# Patient Record
Sex: Male | Born: 1990 | Hispanic: Yes | Marital: Single | Smoking: Former smoker
Health system: Southern US, Community
[De-identification: ages and names within clinical notes are randomized; demographics above are authoritative.]

## PROBLEM LIST (undated history)

## (undated) HISTORY — PX: OTHER SURGICAL HISTORY: SHX169

---

## 2011-02-13 ENCOUNTER — Encounter (HOSPITAL_COMMUNITY): Payer: Self-pay | Admitting: *Deleted

## 2011-02-13 ENCOUNTER — Other Ambulatory Visit: Payer: Self-pay

## 2011-02-13 ENCOUNTER — Emergency Department (HOSPITAL_COMMUNITY)
Admission: EM | Admit: 2011-02-13 | Discharge: 2011-02-13 | Disposition: A | Discharge status: Home / Self Care | Payer: No Typology Code available for payment source | Attending: Emergency Medicine | Admitting: Emergency Medicine

## 2011-02-13 DIAGNOSIS — F329 Major depressive disorder, single episode, unspecified: Secondary | ICD-10-CM | POA: Insufficient documentation

## 2011-02-13 DIAGNOSIS — R0602 Shortness of breath: Secondary | ICD-10-CM | POA: Insufficient documentation

## 2011-02-13 DIAGNOSIS — F410 Panic disorder [episodic paroxysmal anxiety] without agoraphobia: Secondary | ICD-10-CM | POA: Insufficient documentation

## 2011-02-13 DIAGNOSIS — F411 Generalized anxiety disorder: Secondary | ICD-10-CM | POA: Insufficient documentation

## 2011-02-13 DIAGNOSIS — R002 Palpitations: Secondary | ICD-10-CM | POA: Insufficient documentation

## 2011-02-13 DIAGNOSIS — F3289 Other specified depressive episodes: Secondary | ICD-10-CM | POA: Insufficient documentation

## 2011-02-13 DIAGNOSIS — R000 Tachycardia, unspecified: Secondary | ICD-10-CM | POA: Insufficient documentation

## 2011-02-13 LAB — POCT I-STAT, CHEM 8
BUN: 11 mg/dL (ref 6–23)
Calcium, Ion: 1.09 mmol/L — ABNORMAL LOW (ref 1.12–1.32)
Chloride: 104 mEq/L (ref 96–112)
Creatinine, Ser: 0.9 mg/dL (ref 0.50–1.35)

## 2011-02-13 MED ORDER — LORAZEPAM 1 MG PO TABS
1.0000 mg | ORAL_TABLET | Freq: Once | ORAL | Status: AC
  Administered 2011-02-13: 1 mg via ORAL
  Filled 2011-02-13: qty 1

## 2011-02-13 NOTE — ED Notes (Signed)
Patient c/o sudden onset palpitations that began approx. 1 hr ago accompanied with a progressive onset of numbness from abdomen up to face. Reports being able to feel touch but everything feels like it is "contracting". Patient currently on cardiac monitor, instructed to bear down with no relief. HR remains sinus in the 130s-140s. Patient very anxious at time of assessment.

## 2011-02-13 NOTE — ED Provider Notes (Signed)
History     CSN: [id number]  Arrival date & time 02/13/11  1517   First MD Initiated Contact with Patient 02/13/11 1947      Chief Complaint  Patient presents with  . Palpitations    (Consider location/radiation/quality/duration/timing/severity/associated sxs/prior treatment) Patient is a 21 y.o. male presenting with palpitations. The history is provided by the patient.  Palpitations  This is a new problem. The current episode started 1 to 2 hours ago. The problem occurs constantly. The problem has not changed since onset.The problem is associated with anxiety. On average, each episode lasts 2 hours. Associated symptoms include shortness of breath. Pertinent negatives include no diaphoresis, no fever, no malaise/fatigue, no chest pain, no chest pressure, no abdominal pain and no lower extremity edema. Associated symptoms comments: Arms and legs tingling, feeling of impending doomed, very anxious. He has tried nothing for the symptoms. The treatment provided no relief. Risk factors include stress. His past medical history does not include anemia, heart disease, hyperthyroidism or valve disorder.    History reviewed. No pertinent past medical history.  History reviewed. No pertinent past surgical history.  History reviewed. No pertinent family history.  History  Substance Use Topics  . Smoking status: Not on file  . Smokeless tobacco: Not on file  . Alcohol Use: Not on file      Review of Systems  Constitutional: Negative for fever, malaise/fatigue and diaphoresis.  Respiratory: Positive for shortness of breath.   Cardiovascular: Positive for palpitations. Negative for chest pain.  Gastrointestinal: Negative for abdominal pain.  All other systems reviewed and are negative.    Allergies  Review of patient's allergies indicates no known allergies.  Home Medications  No current outpatient prescriptions on file.  BP 157/89  Pulse 119  Temp(Src) 97.3 F (36.3 C) (Oral)   Resp 18  SpO2 100%  Physical Exam  Nursing note and vitals reviewed. Constitutional: He is oriented to person, place, and time. He appears well-developed and well-nourished. No distress.       tearful  HENT:  Head: Normocephalic and atraumatic.  Mouth/Throat: Oropharynx is clear and moist.  Eyes: Conjunctivae and EOM are normal. Pupils are equal, round, and reactive to light.  Neck: Normal range of motion. Neck supple.  Cardiovascular: Regular rhythm and intact distal pulses.  Tachycardia present.   No murmur heard. Pulmonary/Chest: Effort normal and breath sounds normal. No respiratory distress. He has no wheezes. He has no rales.  Abdominal: Soft. He exhibits no distension. There is no tenderness. There is no rebound and no guarding.  Musculoskeletal: Normal range of motion. He exhibits no edema and no tenderness.  Neurological: He is alert and oriented to person, place, and time.  Skin: Skin is warm and dry. No rash noted. No erythema.  Psychiatric: His behavior is normal. His mood appears anxious. He exhibits a depressed mood. He expresses no homicidal and no suicidal ideation.    ED Course  Procedures (including critical care time)  Labs Reviewed  POCT I-STAT, CHEM 8 - Abnormal; Notable for the following:    Potassium 3.4 (*)    Glucose, Bld 163 (*)    Calcium, Ion 1.09 (*)    All other components within normal limits  TSH   No results found.   Date: 02/13/2011  Rate: 121  Rhythm: sinus tachycardia  QRS Axis: right  Intervals: normal  ST/T Wave abnormalities: normal  Conduction Disutrbances:none  Narrative Interpretation:   Old EKG Reviewed: none available    1. Panic attack  MDM   Patient with history suggestive of panic attack tonight. Palpitations and feeling as if he were going to die. Here he was tachycardic with a heart rate from 120-140 EKG taken at that time showed a regular sinus rhythm during both episodes once with heart rate of 1:30 and once  with 120. Patient was hyperventilating and standing he can feel his arms or his legs. After coached to take slow deep breaths and to relax his heart rate improved to 100 and and he was feeling better. After long discussion with him it seems as if the patient is depressed even though he denies depression. He states his problems are worse in anybody else's and denies any suicidal ideation. He states occasionally he referred himself to feel pain. He denies any substance use. TSH sent however low suspicion for hyperthyroidism. I-STAT within normal limits. Ativan given which he states helped.        Gwyneth Sprout, MD 02/13/11 [phone]

## 2011-02-13 NOTE — ED Notes (Signed)
Pt in c/o episode of palpitations, states he was standing in line to get food when this started, worsened, noted numbness in hands and arms, pt anxious at this time

## 2011-02-14 LAB — TSH: TSH: 1.062 u[IU]/mL (ref 0.350–4.500)

## 2011-06-25 ENCOUNTER — Encounter: Payer: Self-pay | Admitting: Internal Medicine

## 2011-06-25 ENCOUNTER — Ambulatory Visit (INDEPENDENT_AMBULATORY_CARE_PROVIDER_SITE_OTHER): Payer: No Typology Code available for payment source | Admitting: Internal Medicine

## 2011-06-25 DIAGNOSIS — R1013 Epigastric pain: Secondary | ICD-10-CM

## 2011-06-25 DIAGNOSIS — R000 Tachycardia, unspecified: Secondary | ICD-10-CM

## 2011-06-25 DIAGNOSIS — F419 Anxiety disorder, unspecified: Secondary | ICD-10-CM

## 2011-06-25 DIAGNOSIS — E876 Hypokalemia: Secondary | ICD-10-CM

## 2011-06-25 DIAGNOSIS — F411 Generalized anxiety disorder: Secondary | ICD-10-CM

## 2011-06-25 DIAGNOSIS — K3189 Other diseases of stomach and duodenum: Secondary | ICD-10-CM

## 2011-06-25 DIAGNOSIS — R9431 Abnormal electrocardiogram [ECG] [EKG]: Secondary | ICD-10-CM

## 2011-06-25 DIAGNOSIS — R002 Palpitations: Secondary | ICD-10-CM

## 2011-06-25 MED ORDER — ESOMEPRAZOLE MAGNESIUM 40 MG PO CPDR: 40.0000 mg | DELAYED_RELEASE_CAPSULE | Freq: Every day | ORAL | Status: DC

## 2011-06-25 NOTE — Progress Notes (Addendum)
Patient ID: Arthur Hood, male   DOB: 05-13-1990, 22 y.o.   MRN: [id number]  Patient Active Problem List  Diagnosis  . Anxiety  . Abnormal EKG  . Dyspepsia    Subjective:  CC:   Chief Complaint  Patient presents with  . New Patient    HPI:   Arthur Hood a 21 y.o. male who presents as a New patient here to establish primary care .  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  His cc is a request for workup due to recent episode of tachycardia followed by his entire body going numb.  He was evaluated at [facility] ER when episode occurred and was treated for panic attack.  This episode was the culmination of long period of "not feeling well."  Although most symptoms resolved ,  He continues to describe a burning discomfort in his abdomen which has been present for about 4 months.  .  Previously he had a visual phenomenon of feeling of darkness (Like someone had turned out the light),  Which resolved a month ago.  Chest pain and palpitations were also experienced previously, and he had an isolated episode during freshman year as well.  These symptoms resolved when he went home from school.  Last week  He had 2 days of excessive urination (10 or 15 voids per day) without pain or burning, which resolved spontaneously.  He does admit to borrowing a friend's ADD medication on occasion to help him concentrate (wasn't sure what it was,  10 mg dose?)  But stopped using it and the nervousness resolved.  Still having reflux symptoms treated with his mother's  zantac, which caused headaches, but is tolerating a reduced dose.  Currently taking no medications regularly, last zantac dose 1 week ago,  No current symptoms.  Has some test taking anxiety .  Ran track in high school.    History reviewed. No pertinent past medical history.  Past Surgical History  Procedure Date  . Adnoidectomy          The following portions of the patient's history were reviewed and updated as appropriate:  Allergies, current medications, and problem list.    Review of Systems:   12 Pt  review of systems was negative except those addressed in the HPI,     History   Social History  . Marital Status: Single    Spouse Name: N/A    Number of Children: N/A  . Years of Education: N/A   Occupational History  . Not on file.   Social History Main Topics  . Smoking status: Former Smoker    Types: Cigarettes    Quit date: 06/25/2010  . Smokeless tobacco: Never Used  . Alcohol Use: Yes  . Drug Use: Yes  . Sexually Active: Not on file   Other Topics Concern  . Not on file   Social History Narrative  . No narrative on file    Objective:  BP 132/70  Pulse 97  Temp 98 F (36.7 C) (Oral)  Resp 16  Ht 5' 8.5" (1.74 m)  Wt 171 lb 8 oz (77.792 kg)  BMI 25.70 kg/m2  SpO2 97%  General appearance: alert, cooperative and appears stated age Ears: normal TM's and external ear canals both ears Throat: lips, mucosa, and tongue normal; teeth and gums normal Neck: no adenopathy, no carotid bruit, supple, symmetrical, trachea midline and thyroid not enlarged, symmetric, no tenderness/mass/nodules Back: symmetric, no curvature. ROM normal. No CVA tenderness.  Lungs: clear to auscultation bilaterally Heart: regular rate and rhythm, S1, S2 normal, no murmur, click, rub or gallop Abdomen: soft, non-tender; bowel sounds normal; no masses,  no organomegaly Pulses: 2+ and symmetric Skin: Skin color, texture, turgor normal. No rashes or lesions Lymph nodes: Cervical, supraclavicular, and axillary nodes normal.  Assessment and Plan:  Abnormal EKG He has had episodes of palpitations which were likely brought on by use of stimulants,  But his EKG has borderline criteria for LVH.  Will refer to cardiology for ECHO/  Anxiety With prior panic attacks described, possibly brought on by use of stimulants.  Discussed risks of using these and other nonprescribed medications.  Will check TSH,  Return in  one month and consider SSRI vs beta blocker for test taking anxiety.   Dyspepsia With several family members described as having stomach problems,  May be gastritis vs IBS. Trial of nexium with 2 weeks of samples given.    Updated Medication List Outpatient Encounter Prescriptions as of 06/25/2011  Medication Sig Dispense Refill  . ranitidine (ZANTAC) 75 MG tablet Take 75 mg by mouth 2 (two) times daily.      Marland Kitchen esomeprazole (NEXIUM) 40 MG capsule Take 1 capsule (40 mg total) by mouth daily before breakfast.  10 capsule  0     Orders Placed This Encounter  Procedures  . COMPLETE METABOLIC PANEL WITH GFR  . TSH  . Calcium, ionized  . Magnesium  . Electrocardiogram report    Return in about 1 week (around 07/02/2011).

## 2011-06-25 NOTE — Patient Instructions (Signed)
I have given you samples of a stomach medication called Nexium.  Taken once daily,  It will block acid production and resolve your symptoms if they are coming from reflux  Return in one week to discuss how to treat your anxiety

## 2011-06-26 DIAGNOSIS — R1013 Epigastric pain: Secondary | ICD-10-CM | POA: Insufficient documentation

## 2011-06-26 DIAGNOSIS — R9431 Abnormal electrocardiogram [ECG] [EKG]: Secondary | ICD-10-CM | POA: Insufficient documentation

## 2011-06-26 NOTE — Assessment & Plan Note (Signed)
With several family members described as having stomach problems,  May be gastritis vs IBS. Trial of nexium with 2 weeks of samples given.

## 2011-06-26 NOTE — Assessment & Plan Note (Signed)
He has had e[pisodes of palpitations which were likely brought on by use of stimulants,  But his EKG has bordelrine criteria for LVH.  Will refer to cardiology for ECHO/

## 2011-06-26 NOTE — Assessment & Plan Note (Signed)
With prior panic attacks described, possibly brought on by use of stimulants.  Discussed risks of using these and other nonprescribed medications.  Will check TSH,  Return in one month and consider SSRI vs beta blocker for test taking anxiety.

## 2011-07-08 ENCOUNTER — Ambulatory Visit (INDEPENDENT_AMBULATORY_CARE_PROVIDER_SITE_OTHER): Payer: No Typology Code available for payment source | Admitting: Internal Medicine

## 2011-07-08 ENCOUNTER — Encounter: Payer: Self-pay | Admitting: Internal Medicine

## 2011-07-08 VITALS — BP 110/66 | HR 70 | Temp 98.4°F | Resp 16 | Wt 168.2 lb

## 2011-07-08 DIAGNOSIS — E878 Other disorders of electrolyte and fluid balance, not elsewhere classified: Secondary | ICD-10-CM

## 2011-07-08 DIAGNOSIS — K3189 Other diseases of stomach and duodenum: Secondary | ICD-10-CM

## 2011-07-08 DIAGNOSIS — R9431 Abnormal electrocardiogram [ECG] [EKG]: Secondary | ICD-10-CM

## 2011-07-08 DIAGNOSIS — E876 Hypokalemia: Secondary | ICD-10-CM

## 2011-07-08 DIAGNOSIS — F419 Anxiety disorder, unspecified: Secondary | ICD-10-CM

## 2011-07-08 DIAGNOSIS — R002 Palpitations: Secondary | ICD-10-CM

## 2011-07-08 DIAGNOSIS — R1013 Epigastric pain: Secondary | ICD-10-CM

## 2011-07-08 DIAGNOSIS — F411 Generalized anxiety disorder: Secondary | ICD-10-CM

## 2011-07-08 LAB — COMPREHENSIVE METABOLIC PANEL
Albumin: 4.2 g/dL (ref 3.5–5.2)
BUN: 13 mg/dL (ref 6–23)
CO2: 28 mEq/L (ref 19–32)
GFR: 101.2 mL/min (ref >60.00)
Glucose, Bld: 95 mg/dL (ref 70–99)
Potassium: 3.5 mEq/L (ref 3.5–5.1)
Sodium: 141 mEq/L (ref 135–145)
Total Bilirubin: 0.5 mg/dL (ref 0.3–1.2)
Total Protein: 7.2 g/dL (ref 6.0–8.3)

## 2011-07-08 LAB — TSH: TSH: 2.03 u[IU]/mL (ref 0.35–5.50)

## 2011-07-08 NOTE — Assessment & Plan Note (Signed)
Now seeing a therapist,  Does not want a beta blocker for test taking anxiety/

## 2011-07-08 NOTE — Assessment & Plan Note (Signed)
Trial of nexium caused nausea.  Recommended trail of famotidine 40 mg or ranitidine 150 mg daily at bedtime and prn gaviscon.

## 2011-07-08 NOTE — Patient Instructions (Addendum)
You can try Pepcid (famotidine)  40 mg daily at bedtime  Or Zantac (ranitidine ) 150 mg at bedtime for the reflux symptoms.    You can try Gas x or mylanta Gas if your gaviscon does not have simethicone in it

## 2011-07-08 NOTE — Assessment & Plan Note (Signed)
During ER eval in Feb had low K,  Low Ca.  Rechecking both now as cause was never evaluated.

## 2011-07-08 NOTE — Assessment & Plan Note (Signed)
Minimal,  With sinus tach and borderline LVH.  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

## 2011-07-08 NOTE — Progress Notes (Signed)
Patient ID: Arthur Hood, male   DOB: 1990-11-07, 21 y.o.   MRN: [id number]   Patient Active Problem List  Diagnosis  . Anxiety  . Abnormal EKG  . Dyspepsia    Subjective:  CC:   Chief Complaint  Patient presents with  . Follow-up    one week    HPI:   Arthur Hood a 21 y.o. male who presents for  follow up on multiple complaints including tachycardia, reflux and anxiety. At first visit was given two week supply of nexium.  3 days of taking it early in the morning on an empty stomach he stopped it due to daily early morning vomiting , which  occurred before eating.  Attributed it to the nexium , stopped the nexium and the symptoms have improved.  The palpitations have resolved but he continues to have a feeling of pressure that lasts a second or two.  Never when exerciing. He has used gaviscon with good results. He is now seeing a therapist at school to help cope with his anxiety.   No uninentional weight loss or abdominal pain.   No past medical history on file.  Past Surgical History  Procedure Date  . Adnoidectomy          The following portions of the patient's history were reviewed and updated as appropriate: Allergies, current medications, and problem list.    Review of Systems:   12 Pt  review of systems was negative except those addressed in the HPI,     History   Social History  . Marital Status: Single    Spouse Name: N/A    Number of Children: N/A  . Years of Education: N/A   Occupational History  . Not on file.   Social History Main Topics  . Smoking status: Former Smoker    Types: Cigarettes    Quit date: 06/25/2010  . Smokeless tobacco: Never Used  . Alcohol Use: Yes  . Drug Use: Yes  . Sexually Active: Not on file   Other Topics Concern  . Not on file   Social History Narrative  . No narrative on file    Objective:  BP 110/66  Pulse 70  Temp 98.4 F (36.9 C) (Oral)  Resp 16  Wt 168 lb 4 oz (76.318 kg)  SpO2  97%  General appearance: alert, cooperative and appears stated age Throat: lips, mucosa, and tongue normal; teeth and gums normal Neck: no adenopathy, no carotid bruit, supple, symmetrical, trachea midline and thyroid not enlarged, symmetric, no tenderness/mass/nodules Back: symmetric, no curvature. ROM normal. No CVA tenderness. Lungs: clear to auscultation bilaterally Heart: regular rate and rhythm, S1, S2 normal, no murmur, click, rub or gallop Abdomen: soft, non-tender; bowel sounds normal; no masses,  no organomegaly Pulses: 2+ and symmetric   Assessment and Plan: Dyspepsia Trial of nexium caused nausea.  Recommended trail of famotidine 40 mg or ranitidine 150 mg daily at bedtime and prn gaviscon.   Abnormal EKG Minimal,  With sinus tach and borderline LVH.  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   Anxiety Now seeing a therapist,  Does not want a beta blocker for test taking anxiety/   Electrolyte abnormality During ER eval in Feb had low K,  Low Ca.  Rechecking both now as cause was never evaluated.    Updated Medication List Outpatient Encounter Prescriptions as of 07/08/2011  Medication Sig Dispense Refill  . DISCONTD: esomeprazole (NEXIUM) 40 MG capsule Take 1 capsule (40  mg total) by mouth daily before breakfast.  10 capsule  0  . DISCONTD: ranitidine (ZANTAC) 75 MG tablet Take 75 mg by mouth 2 (two) times daily.

## 2011-07-09 LAB — CALCIUM, IONIZED: Calcium, Ion: 1.24 mmol/L (ref 1.12–1.32)

## 2014-01-16 ENCOUNTER — Emergency Department (HOSPITAL_COMMUNITY)
Admission: EM | Admit: 2014-01-16 | Discharge: 2014-01-16 | Disposition: A | Discharge status: Home / Self Care | Payer: Worker's Compensation | Attending: Emergency Medicine | Admitting: Emergency Medicine

## 2014-01-16 ENCOUNTER — Encounter (HOSPITAL_COMMUNITY): Payer: Self-pay

## 2014-01-16 ENCOUNTER — Emergency Department (HOSPITAL_COMMUNITY): Payer: Worker's Compensation

## 2014-01-16 DIAGNOSIS — S61319A Laceration without foreign body of unspecified finger with damage to nail, initial encounter: Secondary | ICD-10-CM

## 2014-01-16 DIAGNOSIS — S61012A Laceration without foreign body of left thumb without damage to nail, initial encounter: Secondary | ICD-10-CM

## 2014-01-16 DIAGNOSIS — S6992XA Unspecified injury of left wrist, hand and finger(s), initial encounter: Secondary | ICD-10-CM | POA: Diagnosis present

## 2014-01-16 DIAGNOSIS — Y9289 Other specified places as the place of occurrence of the external cause: Secondary | ICD-10-CM | POA: Insufficient documentation

## 2014-01-16 DIAGNOSIS — Y998 Other external cause status: Secondary | ICD-10-CM | POA: Insufficient documentation

## 2014-01-16 DIAGNOSIS — W230XXA Caught, crushed, jammed, or pinched between moving objects, initial encounter: Secondary | ICD-10-CM | POA: Insufficient documentation

## 2014-01-16 DIAGNOSIS — Z87891 Personal history of nicotine dependence: Secondary | ICD-10-CM | POA: Insufficient documentation

## 2014-01-16 DIAGNOSIS — T1490XA Injury, unspecified, initial encounter: Secondary | ICD-10-CM

## 2014-01-16 DIAGNOSIS — Y9389 Activity, other specified: Secondary | ICD-10-CM | POA: Diagnosis not present

## 2014-01-16 DIAGNOSIS — S61112A Laceration without foreign body of left thumb with damage to nail, initial encounter: Secondary | ICD-10-CM | POA: Diagnosis not present

## 2014-01-16 MED ORDER — OXYCODONE-ACETAMINOPHEN 5-325 MG PO TABS: 1.0000 | ORAL_TABLET | ORAL | Status: AC | PRN

## 2014-01-16 MED ORDER — SULFAMETHOXAZOLE-TRIMETHOPRIM 800-160 MG PO TABS: 1.0000 | ORAL_TABLET | Freq: Two times a day (BID) | ORAL | Status: AC

## 2014-01-16 MED ORDER — BUPIVACAINE HCL (PF) 0.25 % IJ SOLN
30.0000 mL | Freq: Once | INTRAMUSCULAR | Status: AC
  Administered 2014-01-16: 30 mL
  Filled 2014-01-16: qty 30

## 2014-01-16 MED ORDER — LIDOCAINE HCL 1 % IJ SOLN
30.0000 mL | Freq: Once | INTRAMUSCULAR | Status: AC
  Administered 2014-01-16: 30 mL via INTRADERMAL
  Filled 2014-01-16: qty 40

## 2014-01-16 NOTE — ED Notes (Signed)
Dr Kuzma at bedside. 

## 2014-01-16 NOTE — ED Provider Notes (Signed)
Patient seen and examined. Has degloved part of his distal nail bed. Only very small piece of bone in the distal tuft. Nail is mostly unaccounted for. Has sensation and cap refill of the distal tuft. Care discussed with Dr. Merlyn LotKuzma by Ileene HutchinsonLisa Saunders, PA.  Dr. Merlyn LotKuzma is planning repair in the mergency room.  [name], MD 01/16/14 [phone]

## 2014-01-16 NOTE — ED Notes (Addendum)
Pt injured L thumb while cleaning bike. Last tetanus 08/2013. Bleeding controlled.

## 2014-01-16 NOTE — ED Provider Notes (Signed)
CSN: [id number]     Arrival date & time 01/16/14  1610 History  This chart was scribed for non-physician practitioner Sharilyn SitesLisa Sanders, PA-C working with Rolland PorterMark James, MD by Littie Deedsichard Sun, ED Scribe. This patient was seen in room WTR8/WTR8 and the patient's care was started at 4:48 PM.     Chief Complaint  Patient presents with  . Finger Injury   The history is provided by the patient. No language interpreter was used.   HPI Comments: Arthur CroonChristian Loveland is a 24 y.o. male who presents to the Emergency Department complaining of an injury to his left thumb that occurred PTA while cleaning a bike.  States finger got caught between the chain and sprocket and when he attempted to pull it out it "ripped his finger".  Immediate onset of pain and bleeding.  Denies numbness or paresthesias of affected finger.Tetanus is UTD.  Last PO intake around 1600.  VS stable on arrival.  History reviewed. No pertinent past medical history. Past Surgical History  Procedure Laterality Date  . Adnoidectomy     Family History  Problem Relation Age of Onset  . Mental illness Mother     anxiety disorder  . Diabetes Neg Hx   . Heart disease Neg Hx   . Cancer Neg Hx    History  Substance Use Topics  . Smoking status: Former Smoker    Types: Cigarettes    Quit date: 06/25/2010  . Smokeless tobacco: Never Used  . Alcohol Use: Yes    Review of Systems  Skin: Positive for wound.  All other systems reviewed and are negative.     Allergies  Shellfish allergy  Home Medications   Prior to Admission medications   Not on File   BP 139/79 mmHg  Pulse 78  Temp(Src) 98.8 F (37.1 C) (Oral)  Resp 18   Physical Exam  Constitutional: He is oriented to person, place, and time. He appears well-developed and well-nourished.  HENT:  Head: Normocephalic and atraumatic.  Mouth/Throat: Oropharynx is clear and moist.  Eyes: Conjunctivae and EOM are normal. Pupils are equal, round, and reactive to light.  Neck: Normal  range of motion.  Cardiovascular: Normal rate, regular rhythm and normal heart sounds.   Pulmonary/Chest: Effort normal and breath sounds normal.  Abdominal: Soft. Bowel sounds are normal.  Musculoskeletal: Normal range of motion.       Left hand: He exhibits tenderness, bony tenderness and laceration. He exhibits normal capillary refill and no swelling. Normal sensation noted. Normal strength noted.       Hands: Left thumb with nail nearly completely avulsed, laceration through nailbed; no signs of FB; full ROM of finger maintained; normal sensation of affected thumb including distal tip; strong radial pulse and cap refill See photo below  Neurological: He is alert and oriented to person, place, and time.  Skin: Skin is warm and dry.  Psychiatric: He has a normal mood and affect.  Nursing note and vitals reviewed.     ED Course  Procedures    COORDINATION OF CARE: 4:48 PM-Discussed treatment plan which includes x-ray with pt at bedside and pt agreed to plan.   Labs Review Labs Reviewed - No data to display  Imaging Review No results found.   EKG Interpretation None      MDM   Final diagnoses:  Injury  Laceration of left thumb, initial encounter  Laceration of nail bed of finger, initial encounter   24 y.o. M with left thumb injury while cleaning bicycle.  On exam, left thumbnail has been avulsed and there is a laceration through the nailbed.  X-ray obtained, confirms open fracture.  Case discussed with hand surgery, Dr. Merlyn Lot, who will come to ED for formal repair.  8:22 PM Dr. Merlyn Lot has repaired wound.  Will have patient FU in office in 1 week.  Recommends bactrim and pain meds for home.   I personally performed the services described in this documentation, which was scribed in my presence. The recorded information has been reviewed and is accurate.  Garlon Hatchet, PA-C 01/16/14 2023  Rolland Porter, MD 01/25/14 [phone]

## 2014-01-16 NOTE — Discharge Instructions (Signed)
Take the prescribed medication as directed. Follow-up with Dr. Merlyn LotKuzma in 1 week in office. Return to the ED for new or worsening symptoms.

## 2014-01-17 NOTE — Consult Note (Signed)
[name]:  Hood, Arthur              ACCOUNT NO.:  [id number]  MEDICAL RECORD NO.:  [id number]  LOCATION:  [id number]                         FAC[facility]  PHYSICIAN:  Betha LoaKevin Lidia Clavijo, MD        DATE OF BIRTH:  01-11-1990  DATE OF CONSULTATION:  01/16/2014 DATE OF DISCHARGE:  01/16/2014                                CONSULTATION   Consult is for left thumb tip crush injury.  HISTORY:  Mr. Arthur Hood is a 24 year old right-hand dominant male, states he was working on his bicycle this evening when his left thumb was pulled into the chain, caused an injury to the tip.  [facility] where he was found to have injury through the nail bed with some tuft fracture.  I was consulted for management of the injury.  ALLERGIES:  TREE NUTS.  PAST MEDICAL HISTORY:  None.  PAST SURGICAL HISTORY:  Tonsils.  MEDICATIONS:  None.  FAMILY HISTORY:  Noncontributory.  MEDICATIONS:  None.  SOCIAL HISTORY:  Mr. Arthur Hood is in his last year at [employer].  He smokes and drinks occasionally.  REVIEW OF SYSTEMS:  Fourteen point review of systems is negative.  PHYSICAL EXAMINATION:  GENERAL:  Alert and oriented x3, well developed, well nourished, he is resting comfortably in the hospital stretcher. EXTREMITIES:  Right upper extremity is without wounds, without tenderness to palpation.  He flexed his IP joint and thumb and cross his fingers.  Left upper extremity with the exception of thumb, no wounds or tenderness to palpation.  He can flex his IP joint and thumb across his fingers.  He has a wound at the distal aspect of the thumb through the nail bed and onto the ulnar side of the thumb.  There was some crease on the thumb, otherwise no gross contamination.  RADIOGRAPHS:  AP, lateral, and oblique views of the thumb show some tuft fracture fragments.  ASSESSMENT AND PLAN:  Left thumb open distal phalanx fracture with nail bed injury.  I discussed with Mr. Arthur Hood and his girlfriend,  the nature of the injury.  I recommended irrigation and debridement and repair of the injury in the emergency department under digital block.  Risks, benefits, and alternatives of procedure were discussed, they wished to proceed.  PROCEDURE NOTE:  Digital block was performed with 10 mL of 1% plain lidocaine and 0.25% plain Marcaine.  This was adequate to give total digital anesthesia to the left thumb.  The wound was copiously irrigated with 1000 mL of sterile saline and Betadine solution.  The thumb was then prepped and draped sterilely with Betadine and sterile towels.  The remainder of the nail was removed.  The Penrose drain was used as a tourniquet was up for approximately 20 minutes.  The wound was cleared of clot.  It was reduced.  A small bone fragment was removed.  A 5-0 Monocryl suture was used to repair the skin edges.  The nail bed was repaired with 6-0 chromic suture in an interrupted fashion.  Good apposition of soft tissue was obtained.  The nail fold was held open with a piece of Xeroform and all wounds dressed with sterile Xeroform, 4x4s,  and wrapped with Coban dressing lightly.  An AlumaFoam splint was placed and wrapped with the Coban dressing lightly.  Penrose drain was removed.  He tolerated the procedure well.  He will be given pain medication and antibiotics per the emergency department.  I will see him back in the office in 1 week for postprocedure followup.     Betha Loa, MD     KK/MEDQ  D:  01/16/2014  T:  01/17/2014  Job:  [id number]

## 2015-02-14 ENCOUNTER — Emergency Department (HOSPITAL_COMMUNITY)
Admission: EM | Admit: 2015-02-14 | Discharge: 2015-02-14 | Disposition: A | Discharge status: Home / Self Care | Payer: BLUE CROSS/BLUE SHIELD | Attending: Emergency Medicine | Admitting: Emergency Medicine

## 2015-02-14 ENCOUNTER — Emergency Department (HOSPITAL_COMMUNITY): Payer: BLUE CROSS/BLUE SHIELD

## 2015-02-14 ENCOUNTER — Encounter (HOSPITAL_COMMUNITY): Payer: Self-pay | Admitting: Cardiology

## 2015-02-14 DIAGNOSIS — Y9389 Activity, other specified: Secondary | ICD-10-CM | POA: Insufficient documentation

## 2015-02-14 DIAGNOSIS — Z792 Long term (current) use of antibiotics: Secondary | ICD-10-CM | POA: Insufficient documentation

## 2015-02-14 DIAGNOSIS — Y990 Civilian activity done for income or pay: Secondary | ICD-10-CM | POA: Diagnosis not present

## 2015-02-14 DIAGNOSIS — W268XXA Contact with other sharp object(s), not elsewhere classified, initial encounter: Secondary | ICD-10-CM | POA: Diagnosis not present

## 2015-02-14 DIAGNOSIS — S61311A Laceration without foreign body of left index finger with damage to nail, initial encounter: Secondary | ICD-10-CM

## 2015-02-14 DIAGNOSIS — Z87891 Personal history of nicotine dependence: Secondary | ICD-10-CM | POA: Insufficient documentation

## 2015-02-14 DIAGNOSIS — Z23 Encounter for immunization: Secondary | ICD-10-CM | POA: Insufficient documentation

## 2015-02-14 DIAGNOSIS — Y9289 Other specified places as the place of occurrence of the external cause: Secondary | ICD-10-CM | POA: Diagnosis not present

## 2015-02-14 MED ORDER — TETANUS-DIPHTH-ACELL PERTUSSIS 5-2.5-18.5 LF-MCG/0.5 IM SUSP
0.5000 mL | Freq: Once | INTRAMUSCULAR | Status: AC
  Administered 2015-02-14: 0.5 mL via INTRAMUSCULAR
  Filled 2015-02-14: qty 0.5

## 2015-02-14 MED ORDER — LIDOCAINE HCL (PF) 1 % IJ SOLN
5.0000 mL | Freq: Once | INTRAMUSCULAR | Status: AC
  Administered 2015-02-14: 5 mL
  Filled 2015-02-14: qty 5

## 2015-02-14 MED ORDER — LIDOCAINE HCL (PF) 1 % IJ SOLN
5.0000 mL | Freq: Once | INTRAMUSCULAR | Status: AC
Start: 2015-02-14 — End: 2015-02-14
  Administered 2015-02-14: 5 mL
  Filled 2015-02-14: qty 5

## 2015-02-14 NOTE — ED Provider Notes (Signed)
History  By signing my name below, I, Karle Plumber, attest that this documentation has been prepared under the direction and in the presence of [id number]meyer, [employer]. Electronically Signed: Karle Plumber, ED Scribe. 02/14/2015. 2:17 PM.  Chief Complaint  Patient presents with  . Laceration   Patient is a 25 y.o. male presenting with skin laceration. The history is provided by the patient and medical records. No language interpreter was used.  Laceration Location:  Finger Finger laceration location:  L index finger Quality: straight   Bleeding: controlled   Time since incident:  1 hour Laceration mechanism:  Metal edge Pain details:    Quality: sore.   Severity:  Mild   Timing:  Constant   Progression:  Unchanged Foreign body present:  No foreign bodies Relieved by:  None tried Worsened by:  Nothing tried Ineffective treatments:  None tried Tetanus status:  Unknown   HPI Comments:  Arthur Hood is a 25 y.o. 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 He reports constant mild soreness of the fingertip, nonradiating, and rates it at 5/10. He reports associated laceration across the nail with bleeding that has been controlled. He has not taken anything for pain. Moving the finger increases the pain. He denies alleviating factors. He denies numbness, tingling or weakness of the left index finger, loss of ROM in finger, light-headedness, LOC, or any other injuries. He is unsure of his last tetanus vaccination. No bleeding disorders or blood thinners. Pt was not wearing gloves.  History reviewed. No pertinent past medical history. Past Surgical History  Procedure Laterality Date  . Adnoidectomy     Family History  Problem Relation Age of Onset  . Mental illness Mother     anxiety disorder  . Diabetes Neg Hx   . Heart disease Neg Hx   . Cancer  Neg Hx    Social History  Substance Use Topics  . Smoking status: Former Smoker    Types: Cigarettes    Quit date: 06/25/2010  . Smokeless tobacco: Never Used  . Alcohol Use: Yes    Review of Systems  Musculoskeletal: Positive for arthralgias (L index fingertip). Negative for joint swelling.  Skin: Positive for wound.  Allergic/Immunologic: Negative for immunocompromised state.  Neurological: Negative for weakness, light-headedness and numbness.  Hematological: Does not bruise/bleed easily.   A complete 10 system review of systems was obtained and all systems are negative except as noted in the HPI and PMH.   Allergies  Shellfish allergy  Home Medications   Prior to Admission medications   Medication Sig Start Date End Date Taking? Authorizing Provider  oxyCODONE-acetaminophen (PERCOCET/ROXICET) 5-325 MG per tablet Take 1 tablet by mouth every 4 (four) hours as needed. 01/16/14   Garlon Hatchet, PA-C  sulfamethoxazole-trimethoprim (SEPTRA DS) 800-160 MG per tablet Take 1 tablet by mouth every 12 (twelve) hours. 01/16/14   Garlon Hatchet, PA-C   Triage Vitals: BP 128/83 mmHg  Pulse 88  Temp(Src) 97.8 F (36.6 C) (Oral)  Resp 18  Wt 168 lb (76.204 kg)  SpO2 99% Physical Exam  Constitutional: He is oriented to person, place, and time. Vital signs are normal. He appears well-developed and well-nourished.  Non-toxic appearance. No distress.  Afebrile, nontoxic, NAD  HENT:  Head: Normocephalic and atraumatic.  Mouth/Throat: Mucous membranes are normal.  Eyes: Conjunctivae and EOM are normal. Right eye exhibits no  discharge. Left eye exhibits no discharge.  Neck: Normal range of motion. Neck supple.  Cardiovascular: Normal rate and intact distal pulses.   Pulmonary/Chest: Effort normal. No respiratory distress.  Abdominal: Normal appearance. He exhibits no distension.  Musculoskeletal: Normal range of motion.  Left index finger with FROM intact, with laceration across the nail  and nail bed but with nail still attached both proximally and distally to the laceration. Slight oozing bleeding. No focal TTP. No deformities or crepitus. Strength and sensation grossly intact. Distal pulses intact. SEE PICTURE BELOW.  Neurological: He is alert and oriented to person, place, and time. He has normal strength. No sensory deficit.  Skin: Skin is warm and dry. Laceration noted. No rash noted.  Left finger laceration as noted above and picture below.  Psychiatric: He has a normal mood and affect.  Nursing note and vitals reviewed.      ED Course  .Marland KitchenLaceration Repair Date/Time: 02/14/2015 2:26 PM Performed by: Allen Derry Authorized by: Allen Derry Consent: Verbal consent obtained. Risks and benefits: risks, benefits and alternatives were discussed Consent given by: patient Patient understanding: patient states understanding of the procedure being performed Patient consent: the patient's understanding of the procedure matches consent given Patient identity confirmed: verbally with patient Body area: upper extremity Location details: left index finger Laceration length: 1 cm Foreign bodies: no foreign bodies Tendon involvement: none Nerve involvement: none Vascular damage: no Anesthesia: digital block Local anesthetic: lidocaine 1% without epinephrine Anesthetic total: 4 ml Patient sedated: no Preparation: Patient was prepped and draped in the usual sterile fashion. Irrigation solution: saline Irrigation method: syringe Amount of cleaning: extensive Debridement: none Degree of undermining: none Subcutaneous closure: 6-0 Chromic gut Number of sutures: 1 Technique: simple Approximation: close Approximation difficulty: complex Dressing: splint, antibiotic ointment and pressure dressing Patient tolerance: Patient tolerated the procedure well with no immediate complications  .Nerve Block Date/Time: 02/14/2015 2:27 PM Performed by:  Allen Derry Authorized by: Allen Derry Consent: Verbal consent obtained. Risks and benefits: risks, benefits and alternatives were discussed Consent given by: patient Patient understanding: patient states understanding of the procedure being performed Patient consent: the patient's understanding of the procedure matches consent given Patient identity confirmed: verbally with patient Indications: pain relief Body area: upper extremity Nerve: digital Laterality: left Patient sedated: no Preparation: Patient was prepped and draped in the usual sterile fashion. Patient position: sitting Needle gauge: 25 G Location technique: anatomical landmarks Local anesthetic: lidocaine 1% without epinephrine Anesthetic total: 4 ml Outcome: pain improved Patient tolerance: Patient tolerated the procedure well with no immediate complications   (including critical care time) DIAGNOSTIC STUDIES: Oxygen Saturation is 99% on RA, normal by my interpretation.   COORDINATION OF CARE: 1:42 PM- Will X-Ray left index finger. Will update tetanus vaccination. Offered pain medication but pt declined. Pt verbalizes understanding and agrees to plan.  Medications  lidocaine (PF) (XYLOCAINE) 1 % injection 5 mL (5 mLs Infiltration Given by Other 02/14/15 1349)  Tdap (BOOSTRIX) injection 0.5 mL (0.5 mLs Intramuscular Given 02/14/15 1349)  lidocaine (PF) (XYLOCAINE) 1 % injection 5 mL (5 mLs Infiltration Given by Other 02/14/15 1407)    Imaging Review Dg Finger Index Left  02/14/2015  CLINICAL DATA:  Laceration to distal tip of LEFT index finger question foreign body or fracture EXAM: LEFT INDEX FINGER 2+V COMPARISON:  None FINDINGS: Osseous mineralization normal. Joint spaces preserved. No fracture, dislocation, or bone destruction. No radiopaque foreign body identified. IMPRESSION: Normal exam. Electronically Signed   By: Angelyn Punt.D.  On: 02/14/2015 14:02   I have personally reviewed and  evaluated these images and lab results as part of my medical decision-making.   MDM   Final diagnoses:  Laceration of left index finger w/o foreign body with damage to nail, initial encounter    25 y.o. male here with L index finger lac across the nailbed and nail, although nail is still intact. Some oozing bleeding still noted to wound. No focal TTP, no deformities, no obvious FBs. NVI with soft compartments of hand/finger. Will obtain xray to ensure no underlying tuft fx. Will likely proceed with suture repair of nail bed. Pt declined pain meds. Will reassess shortly  2:28 PM Xray neg. Digital block performed, attempted to remove distal nail but this was adhered to nail bed, so I proceeded with using dissolvable suture material and suturing through proximal nail, into nail bed, and back out through the distal nail portion. This caused good hemostasis and nail bed repair. Dressing applied, splint applied to protect it while it heals. Doubt need for abx. Discussed wound care, and f/up with PCP in 3 days then again in 7 days for ongoing management. RICE discussed, tylenol/motrin for pain. I explained the diagnosis and have given explicit precautions to return to the ER including for any other new or worsening symptoms. The patient understands and accepts the medical plan as it's been dictated and I have answered their questions. Discharge instructions concerning home care and prescriptions have been given. The patient is STABLE and is discharged to home in good condition.   I personally performed the services described in this documentation, which was scribed in my presence. The recorded information has been reviewed and is accurate.  BP 128/83 mmHg  Pulse 88  Temp(Src) 97.8 F (36.6 C) (Oral)  Resp 18  Wt 76.204 kg  SpO2 99%  Meds ordered this encounter  Medications  . lidocaine (PF) (XYLOCAINE) 1 % injection 5 mL    Sig:   . Tdap (BOOSTRIX) injection 0.5 mL    Sig:   . lidocaine (PF)  (XYLOCAINE) 1 % injection 5 mL    Sig:        Janyra Barillas Camprubi-Soms, PA-C 02/14/15 1430  Gerhard Munch, MD 02/14/15 1534

## 2015-02-14 NOTE — ED Notes (Signed)
Laceration to left index finger tip to include nail bed. Bleeding controlled.

## 2015-02-14 NOTE — ED Notes (Signed)
Pt has a laceration the the left index finger from working on a bike. Bleeding controlled.

## 2015-02-14 NOTE — Discharge Instructions (Signed)
Keep wound clean with mild soap and water twice daily starting tomorrow. Keep area covered with a topical antibiotic ointment and bandage, keep bandage dry, and use the splint for at least 1 week. Ice and elevate for additional pain relief and swelling. Alternate between ibuprofen and Tylenol for additional pain relief. Follow up with your primary care doctor or the [facility] in approximately 3 days for wound recheck and again in 7 days for recheck. The suture should dissolve on its own. Your nail will eventually fall off on its own. Monitor area for signs of infection to include, but not limited to: increasing pain, spreading redness, drainage/pus, worsening swelling, or fevers. Return to emergency department for emergent changing or worsening symptoms.   Nail Bed Injury The nail bed is the soft tissue under a fingernail or toenail. Various types of injuries can occur at the nail bed. These may involve bruising or bleeding under the nail, cuts in the nail or nail bed, or loss of the nail or a part of it. It can take several months for a damaged nail to regrow. In some cases, the nail may not grow back normally. HOME CARE  Raise (elevate) the injured part to lessen pain and puffiness (swelling).   For an injured toenail, lie with your leg on pillows. Avoid walking or letting your leg dangle. When you walk, wear an open-toe shoe.   For an injured fingernail, keep your hand above the level of your heart. Use pillows on a table or on the arm of your chair while sitting. Use pillows on your bed while sleeping.   Use bandages or splints as told by your doctor.   Keep your bandage (dressing) clean and dry. Change your bandage as told by your doctor.   Only take medicine as told by your doctor.   See your doctor as needed. GET HELP RIGHT AWAY IF:   You have more pain, leaking fluid (drainage), or bleeding in the injured area.   You have redness, soreness, and puffiness  (inflammation) in the injured area.  You have a fever or lasting symptoms for more than 2-3 days.  You have a fever and your symptoms suddenly get worse.  You have puffiness that spreads from your finger into your hand or from your toe into your foot.  MAKE SURE YOU:  Understand these instructions.  Will watch this condition.  Will get help right away if you are not doing well or get worse.   This information is not intended to replace advice given to you by your health care provider. Make sure you discuss any questions you have with your health care provider.   Document Released: 12/12/2008 Document Revised: 04/20/2012 Document Reviewed: 01/16/2012 Elsevier Interactive Patient Education 2016 [employer].  Stitches, [place], or Adhesive Wound Closure Health care providers use stitches (sutures), staples, and certain glue (skin adhesives) to hold skin together while it heals (wound closure). You may need this treatment after you have surgery or if you cut your skin accidentally. These methods help your skin to heal more quickly and make it less likely that you will have a scar. A wound may take several months to heal completely. The type of wound you have determines when your wound gets closed. In most cases, the wound is closed as soon as possible (primary skin closure). Sometimes, closure is delayed so the wound can be cleaned and allowed to heal naturally. This reduces the chance of infection. Delayed closure may be  needed if your wound:  Is caused by a bite.  Happened more than 6 hours ago.  Involves loss of skin or the tissues under the skin.  Has dirt or debris in it that cannot be removed.  Is infected. WHAT ARE THE DIFFERENT KINDS OF WOUND CLOSURES? There are many options for wound closure. The one that your health care provider uses depends on how deep and how large your wound is. Adhesive Glue To use this type of glue to close a wound, your health care provider holds  the edges of the wound together and paints the glue on the surface of your skin. You may need more than one layer of glue. Then the wound may be covered with a light bandage (dressing). This type of skin closure may be used for small wounds that are not deep (superficial). Using glue for wound closure is less painful than other methods. It does not require a medicine that numbs the area (local anesthetic). This method also leaves nothing to be removed. Adhesive glue is often used for children and on facial wounds. Adhesive glue cannot be used for wounds that are deep, uneven, or bleeding. It is not used inside of a wound.  Adhesive Strips These strips are made of sticky (adhesive), porous paper. They are applied across your skin edges like a regular adhesive bandage. You leave them on until they fall off. Adhesive strips may be used to close very superficial wounds. They may also be used along with sutures to improve the closure of your skin edges.  Sutures Sutures are the oldest method of wound closure. Sutures can be made from natural substances, such as silk, or from synthetic materials, such as nylon and steel. They can be made from a material that your body can break down as your wound heals (absorbable), or they can be made from a material that needs to be removed from your skin (nonabsorbable). They come in many different strengths and sizes. Your health care provider attaches the sutures to a steel needle on one end. Sutures can be passed through your skin, or through the tissues beneath your skin. Then they are tied and cut. Your skin edges may be closed in one continuous stitch or in separate stitches. Sutures are strong and can be used for all kinds of wounds. Absorbable sutures may be used to close tissues under the skin. The disadvantage of sutures is that they may cause skin reactions that lead to infection. Nonabsorbable sutures need to be removed. Staples When surgical staples are used to  close a wound, the edges of your skin on both sides of the wound are brought close together. A staple is placed across the wound, and an instrument secures the edges together. Staples are often used to close surgical cuts (incisions). Staples are faster to use than sutures, and they cause less skin reaction. Staples need to be removed using a tool that bends the staples away from your skin. HOW DO I CARE FOR MY WOUND CLOSURE?  Take medicines only as directed by your health care provider.  If you were prescribed an antibiotic medicine for your wound, finish it all even if you start to feel better.  Use ointments or creams only as directed by your health care provider.  Wash your hands with soap and water before and after touching your wound.  Do not soak your wound in water. Do not take baths, swim, or use a hot tub until your health care provider approves.  Ask your health care provider when you can start showering. Cover your wound if directed by your health care provider.  Do not take out your own sutures or staples.  Do not pick at your wound. Picking can cause an infection.  Keep all follow-up visits as directed by your health care provider. This is important. HOW LONG WILL I HAVE MY WOUND CLOSURE?  Leave adhesive glue on your skin until the glue peels away.  Leave adhesive strips on your skin until the strips fall off.  Absorbable sutures will dissolve within several days.  Nonabsorbable sutures and staples must be removed. The location of the wound will determine how long they stay in. This can range from several days to a couple of weeks. WHEN SHOULD I SEEK HELP FOR MY WOUND CLOSURE? Contact your health care provider if:  You have a fever.  You have chills.  You have drainage, redness, swelling, or pain at your wound.  There is a bad smell coming from your wound.  The skin edges of your wound start to separate after your sutures have been removed.  Your wound becomes  thick, raised, and darker in color after your sutures come out (scarring).   This information is not intended to replace advice given to you by your health care provider. Make sure you discuss any questions you have with your health care provider.   Document Released: 09/18/2000 Document Revised: 01/14/2014 Document Reviewed: 06/02/2013 Elsevier Interactive Patient Education 2016 [employer]  Sutured Wound Care Sutures are stitches that can be used to close wounds. Taking care of your wound properly can help to prevent pain and infection. It can also help your wound to heal more quickly. HOW TO CARE FOR YOUR SUTURED WOUND Wound Care  Keep the wound clean and dry.  If you were given a bandage (dressing), you should change it at least once per day or as directed by your health care provider. You should also change it if it becomes wet or dirty.  Keep the wound completely dry for the first 24 hours or as directed by your health care provider. After that time, you may shower or bathe. However, make sure that the wound is not soaked in water until the sutures have been removed.  Clean the wound one time each day or as directed by your health care provider.  Wash the wound with soap and water.  Rinse the wound with water to remove all soap.  Pat the wound dry with a clean towel. Do not rub the wound.  Aftercleaning the wound, apply a thin layer of antibioticointment as directed by your health care provider. This will help to prevent infection and keep the dressing from sticking to the wound.  Have the sutures removed as directed by your health care provider. General Instructions  Take or apply medicines only as directed by your health care provider.  To help prevent scarring, make sure to cover your wound with sunscreen whenever you are outside after the sutures are removed and the wound is healed. Make sure to wear a sunscreen of at least 30 SPF.  If you were prescribed an  antibiotic medicine or ointment, finish all of it even if you start to feel better.  Do not scratch or pick at the wound.  Keep all follow-up visits as directed by your health care provider. This is important.  Check your wound every day for signs of infection. Watch for:   Redness, swelling, or pain.  Fluid, blood,  or pus.  Raise (elevate) the injured area above the level of your heart while you are sitting or lying down, if possible.  Avoid stretching your wound.  Drink enough fluids to keep your urine clear or pale yellow. SEEK MEDICAL CARE IF:  You received a tetanus shot and you have swelling, severe pain, redness, or bleeding at the injection site.  You have a fever.  A wound that was closed breaks open.  You notice a bad smell coming from the wound.  You notice something coming out of the wound, such as wood or glass.  Your pain is not controlled with medicine.  You have increased redness, swelling, or pain at the site of your wound.  You have fluid, blood, or pus coming from your wound.  You notice a change in the color of your skin near your wound.  You need to change the dressing frequently due to fluid, blood, or pus draining from the wound.  You develop a new rash.  You develop numbness around the wound. SEEK IMMEDIATE MEDICAL CARE IF:  You develop severe swelling around the injury site.  Your pain suddenly increases and is severe.  You develop painful lumps near the wound or on skin that is anywhere on your body.  You have a red streak going away from your wound.  The wound is on your hand or foot and you cannot properly move a finger or toe.  The wound is on your hand or foot and you notice that your fingers or toes look pale or bluish.   This information is not intended to replace advice given to you by your health care provider. Make sure you discuss any questions you have with your health care provider.   Document Released: 02/01/2004  Document Revised: 05/10/2014 Document Reviewed: 08/05/2012 Elsevier Interactive Patient Education [employer]

## 2016-12-03 ENCOUNTER — Ambulatory Visit: Payer: Self-pay | Admitting: *Deleted

## 2016-12-03 NOTE — Telephone Encounter (Addendum)
Pt called complaints of chest pressure starting 11/26/16; Per nurse triage protocol, pt encouraged to go to ED but pt declines recommendation and prefers to be seen in office; pt previously seen by Dr Darrick Huntsmanullo greater than 2 years ago but would like to seen as a new patient at [facility] and is requesting appointment on 12/04/16; discussed with Raynelle FanningJulie at [facility]; pt offered appointment and accepted with Dr Alvy BimlerSagardia 12/04/16 at 1100; pt verbalizes understanding; pt also instructed to go to ED or call back if symptoms worsen; pt verbalizes understanding  Reason for Disposition . [1] Chest pain lasts > 5 minutes AND [2] occurred > 3 days ago (72 hours) AND [3] NO chest pain or cardiac symptoms now  Answer Assessment - Initial Assessment Questions 1. LOCATION: "Where does it hurt?"      Light pressure in middle and left chest 2. RADIATION: "Does the pain go anywhere else?" (e.g., into neck, jaw, arms, back)     no 3. ONSET: "When did the chest pain begin?" (Minutes, hours or days)      Last wee 11/26/16 4. PATTERN "Does the pain come and go, or has it been constant since it started?"  "Does it get worse with exertion?"      Contsant; noticed when pt is walking but doesn't bother him when exercising 5. DURATION: "How long does it last" (e.g., seconds, minutes, hours)     days 6. SEVERITY: "How bad is the pain?"  (e.g., Scale 1-10; mild, moderate, or severe)    - MILD (1-3): doesn't interfere with normal activities     - MODERATE (4-7): interferes with normal activities or awakens from sleep    - SEVERE (8-10): excruciating pain, unable to do any normal activities       "very subtle" 7. CARDIAC RISK FACTORS: "Do you have any history of heart problems or risk factors for heart disease?" (e.g., prior heart attack, angina; high blood pressure, diabetes, being overweight, high cholesterol, smoking, or strong family history of heart disease)     Family history 8. PULMONARY RISK FACTORS: "Do you have any history of  lung disease?"  (e.g., blood clots in lung, asthma, emphysema, birth control pills)     Asthma as a child 9. CAUSE: "What do you think is causing the chest pain?"     "? Asthma when a child, ?change in weather, ?mold in room" 10. OTHER SYMPTOMS: "Do you have any other symptoms?" (e.g., dizziness, nausea, vomiting, sweating, fever, difficulty breathing, cough)       no 11. PREGNANCY: "Is there any chance you are pregnant?" "When was your last menstrual period?"       no  Protocols used: CHEST PAIN-A-AH

## 2016-12-04 ENCOUNTER — Ambulatory Visit (INDEPENDENT_AMBULATORY_CARE_PROVIDER_SITE_OTHER): Payer: BLUE CROSS/BLUE SHIELD

## 2016-12-04 ENCOUNTER — Other Ambulatory Visit: Payer: Self-pay

## 2016-12-04 ENCOUNTER — Encounter: Payer: Self-pay | Admitting: Emergency Medicine

## 2016-12-04 ENCOUNTER — Ambulatory Visit: Payer: BLUE CROSS/BLUE SHIELD | Admitting: Emergency Medicine

## 2016-12-04 VITALS — BP 100/68 | HR 71 | Temp 98.5°F | Resp 16 | Ht 68.25 in | Wt 168.2 lb

## 2016-12-04 DIAGNOSIS — R0602 Shortness of breath: Secondary | ICD-10-CM | POA: Insufficient documentation

## 2016-12-04 LAB — POCT CBC
GRANULOCYTE PERCENT: 57.6 % (ref 37–80)
HCT, POC: 44.3 % (ref 43.5–53.7)
HEMOGLOBIN: 14.8 g/dL (ref 14.1–18.1)
Lymph, poc: 1.4 (ref 0.6–3.4)
MCH, POC: 32.4 pg — AB (ref 27–31.2)
MCHC: 33.5 g/dL (ref 31.8–35.4)
MCV: 96.7 fL (ref 80–97)
MID (cbc): 0.4 (ref 0–0.9)
MPV: 7 fL (ref 0–99.8)
PLATELET COUNT, POC: 207 10*3/uL (ref 142–424)
POC Granulocyte: 2.5 (ref 2–6.9)
POC LYMPH PERCENT: 32.6 %L (ref 10–50)
POC MID %: 9.8 %M (ref 0–12)
RBC: 4.58 M/uL — AB (ref 4.69–6.13)
RDW, POC: 12.7 %
WBC: 4.3 10*3/uL — AB (ref 4.6–10.2)

## 2016-12-04 NOTE — Patient Instructions (Addendum)
     IF you received an x-ray today, you will receive an invoice from [facility]. Please contact [facility] at [phone] with questions or concerns regarding your invoice.   IF you received labwork today, you will receive an invoice from [facility]. Please contact [facility] at [phone] with questions or concerns regarding your invoice.   Our billing staff will not be able to assist you with questions regarding bills from these companies.  You will be contacted with the lab results as soon as they are available. The fastest way to get your results is to activate your My Chart account. Instructions are located on the last page of this paperwork. If you have not heard from us regarding the results in 2 weeks, please contact this office.     Shortness of Breath, Adult Shortness of breath means you have trouble breathing. Your lungs are organs for breathing. Follow these instructions at home: Pay attention to any changes in your symptoms. Take these actions to help with your condition:  Do not smoke. Smoking can cause shortness of breath. If you need help to quit smoking, ask your doctor.  Avoid things that can make it harder to breathe, such as: ? Mold. ? Dust. ? Air pollution. ? Chemical smells. ? Things that can cause allergy symptoms (allergens), if you have allergies.  Keep your living space clean and free of mold and dust.  Rest as needed. Slowly return to your usual activities.  Take over-the-counter and prescription medicines, including oxygen and inhaled medicines, only as told by your doctor.  Keep all follow-up visits as told by your doctor. This is important.  Contact a doctor if:  Your condition does not get better as soon as expected.  You have a hard time doing your normal activities, even after you rest.  You have new symptoms. Get help right away if:  You have trouble breathing when you are resting.  You feel light-headed or you  faint.  You have a cough that is not helped by medicines.  You cough up blood.  You have pain with breathing.  You have pain in your chest, arms, shoulders, or belly (abdomen).  You have a fever.  You cannot walk up stairs.  You cannot exercise the way you normally do. This information is not intended to replace advice given to you by your health care provider. Make sure you discuss any questions you have with your health care provider. Document Released: 06/12/2007 Document Revised: 01/11/2016 Document Reviewed: 01/11/2016 Elsevier Interactive Patient Education  2017 Elsevier Inc.  

## 2016-12-04 NOTE — Progress Notes (Signed)
Arthur Hood 26 y.o.   Chief Complaint  Patient presents with  . Shortness of Breath    per patient x 1 weeks    HISTORY OF PRESENT ILLNESS: This is a 26 y.o. male complaining of SOB feeling x 1 week; denies recent travelling or recent infection.  Shortness of Breath  This is a new problem. The current episode started in the past 7 days. The problem occurs intermittently. The problem has been waxing and waning. Pertinent negatives include no abdominal pain, chest pain, ear pain, fever, headaches, hemoptysis, leg pain, leg swelling, neck pain, PND, rash, sore throat, sputum production, swollen glands, syncope, vomiting or wheezing. The symptoms are aggravated by exercise. The patient has no known risk factors for DVT/PE. He has tried nothing for the symptoms. His past medical history is significant for asthma (childhood). There is no history of CAD, chronic lung disease, COPD, DVT, a heart failure, PE, pneumonia or a recent surgery.     Prior to Admission medications   Medication Sig Start Date End Date Taking? Authorizing Provider  oxyCODONE-acetaminophen (PERCOCET/ROXICET) 5-325 MG per tablet Take 1 tablet by mouth every 4 (four) hours as needed. Patient not taking: Reported on 12/04/2016 01/16/14   Arthur Hood, Arthur M, PA-C  sulfamethoxazole-trimethoprim (SEPTRA DS) 800-160 MG per tablet Take 1 tablet by mouth every 12 (twelve) hours. Patient not taking: Reported on 12/04/2016 01/16/14   Arthur Hood, Arthur M, PA-C    Allergies  Allergen Reactions  . Shellfish Allergy Other (See Comments)    Throat itchy    Patient Active Problem List   Diagnosis Date Noted  . Electrolyte abnormality 07/08/2011  . Abnormal EKG 06/26/2011  . Dyspepsia 06/26/2011  . Anxiety 06/25/2011    No past medical history on file.  Past Surgical History:  Procedure Laterality Date  . adnoidectomy      Social History   Socioeconomic History  . Marital status: Single    Spouse name: Not on file  . Number  of children: Not on file  . Years of education: Not on file  . Highest education level: Not on file  Social Needs  . Financial resource strain: Not on file  . Food insecurity - worry: Not on file  . Food insecurity - inability: Not on file  . Transportation needs - medical: Not on file  . Transportation needs - non-medical: Not on file  Occupational History  . Not on file  Tobacco Use  . Smoking status: Former Smoker    Types: Cigarettes    Last attempt to quit: 06/25/2010    Years since quitting: 6.4  . Smokeless tobacco: Never Used  Substance and Sexual Activity  . Alcohol use: Yes  . Drug use: Yes  . Sexual activity: Not on file  Other Topics Concern  . Not on file  Social History Narrative  . Not on file    Family History  Problem Relation Age of Onset  . Mental illness Mother        anxiety disorder  . Diabetes Neg Hx   . Heart disease Neg Hx   . Cancer Neg Hx      Review of Systems  Constitutional: Negative for chills, fever and weight loss.  HENT: Negative.  Negative for ear pain and sore throat.   Eyes: Negative.   Respiratory: Positive for shortness of breath. Negative for cough, hemoptysis, sputum production and wheezing.   Cardiovascular: Negative.  Negative for chest pain, palpitations, leg swelling, syncope and PND.  Gastrointestinal: Negative.  Negative for abdominal pain, constipation, diarrhea, nausea and vomiting.  Genitourinary: Negative.  Negative for dysuria and hematuria.  Musculoskeletal: Negative.  Negative for neck pain.  Skin: Negative.  Negative for rash.  Neurological: Negative.  Negative for dizziness and headaches.  All other systems reviewed and are negative.    Vitals:   12/04/16 1133  BP: 100/68  Pulse: 71  Resp: 16  Temp: 98.5 F (36.9 C)  SpO2: 98%     Physical Exam  Constitutional: He is oriented to person, place, and time. He appears well-developed and well-nourished.  HENT:  Head: Normocephalic.  Nose: Nose normal.    Mouth/Throat: Oropharynx is clear and moist.  Eyes: Conjunctivae and EOM are normal. Pupils are equal, round, and reactive to light.  Neck: Normal range of motion. Neck supple. No JVD present. No thyromegaly present.  Cardiovascular: Normal rate, regular rhythm, normal heart sounds and intact distal pulses.  Pulmonary/Chest: Effort normal and breath sounds normal. No respiratory distress.  Abdominal: Soft. Bowel sounds are normal. He exhibits no distension. There is no tenderness.  Musculoskeletal: Normal range of motion.  Lymphadenopathy:    He has no cervical adenopathy.  Neurological: He is alert and oriented to person, place, and time. No sensory deficit. He exhibits normal muscle tone.  Skin: Skin is warm and dry. Capillary refill takes less than 2 seconds. No rash noted.  Psychiatric: He has a normal mood and affect. His behavior is normal.   Results for orders placed or performed in visit on 12/04/16 (from the past 24 hour(s))  POCT CBC     Status: Abnormal   Collection Time: 12/04/16 12:06 PM  Result Value Ref Range   WBC 4.3 (A) 4.6 - 10.2 K/uL   Lymph, poc 1.4 0.6 - 3.4   POC LYMPH PERCENT 32.6 10 - 50 %L   MID (cbc) 0.4 0 - 0.9   POC MID % 9.8 0 - 12 %Hood   POC Granulocyte 2.5 2 - 6.9   Granulocyte percent 57.6 37 - 80 %G   RBC 4.58 (A) 4.69 - 6.13 Hood/uL   Hemoglobin 14.8 14.1 - 18.1 g/dL   HCT, POC 16.1 09.6 - 53.7 %   MCV 96.7 80 - 97 fL   MCH, POC 32.4 (A) 27 - 31.2 pg   MCHC 33.5 31.8 - 35.4 g/dL   RDW, POC 04.5 %   Platelet Count, POC 207 142 - 424 K/uL   MPV 7.0 0 - 99.8 fL   Dg Chest 2 View  Result Date: 12/04/2016 CLINICAL DATA:  Short of breath EXAM: CHEST  2 VIEW COMPARISON:  None. FINDINGS: Normal heart size. Lungs hyperaerated clear. No pneumothorax. No pleural effusion. IMPRESSION: Hyperaerated and clear lungs. Electronically Signed   By: Jolaine Click Hood.D.   On: 12/04/2016 12:44     ASSESSMENT & PLAN: Arthur Hood was seen today for shortness of  breath.  Diagnoses and all orders for this visit:  Shortness of breath -     POCT CBC -     Comprehensive metabolic panel -     DG Chest 2 View; Future    Patient Instructions       IF you received an x-ray today, you will receive an invoice from [facility]. Please contact [facility] at [phone] with questions or concerns regarding your invoice.   IF you received labwork today, you will receive an invoice from [facility]. Please contact [facility] at [phone] with questions or concerns regarding your invoice.   Our  billing staff will not be able to assist you with questions regarding bills from these companies.  You will be contacted with the lab results as soon as they are available. The fastest way to get your results is to activate your My Chart account. Instructions are located on the last page of this paperwork. If you have not heard from us regarding the results in 2 weeks, please contact this office.     Shortness of Breath, Adult Shortness of breath means you have trouble breathing. Your lungs are organs for breathing. Follow these instructions at home: Pay attention to any changes in your symptoms. Take these actions to help with your condition:  Do not smoke. Smoking can cause shortness of breath. If you need help to quit smoking, ask your doctor.  Avoid things that can make it harder to breathe, such as: ? Mold. ? Dust. ? Air pollution. ? Chemical smells. ? Things that can cause allergy symptoms (allergens), if you have allergies.  Keep your living space clean and free of mold and dust.  Rest as needed. Slowly return to your usual activities.  Take over-the-counter and prescription medicines, including oxygen and inhaled medicines, only as told by your doctor.  Keep all follow-up visits as told by your doctor. This is important.  Contact a doctor if:  Your condition does not get better as soon as expected.  You have a hard time  doing your normal activities, even after you rest.  You have new symptoms. Get help right away if:  You have trouble breathing when you are resting.  You feel light-headed or you faint.  You have a cough that is not helped by medicines.  You cough up blood.  You have pain with breathing.  You have pain in your chest, arms, shoulders, or belly (abdomen).  You have a fever.  You cannot walk up stairs.  You cannot exercise the way you normally do. This information is not intended to replace advice given to you by your health care provider. Make sure you discuss any questions you have with your health care provider. Document Released: 06/12/2007 Document Revised: 01/11/2016 Document Reviewed: 01/11/2016 Elsevier Interactive Patient Education  2017 Elsevier Inc.      Edwina BarthMiguel Merilyn Pagan, MD [facility]

## 2016-12-05 LAB — COMPREHENSIVE METABOLIC PANEL
ALK PHOS: 58 IU/L (ref 39–117)
ALT: 10 IU/L (ref 0–44)
AST: 14 IU/L (ref 0–40)
Albumin/Globulin Ratio: 1.8 (ref 1.2–2.2)
Albumin: 4.7 g/dL (ref 3.5–5.5)
BILIRUBIN TOTAL: 0.5 mg/dL (ref 0.0–1.2)
BUN/Creatinine Ratio: 11 (ref 9–20)
BUN: 9 mg/dL (ref 6–20)
CHLORIDE: 103 mmol/L (ref 96–106)
CO2: 26 mmol/L (ref 20–29)
CREATININE: 0.8 mg/dL (ref 0.76–1.27)
Calcium: 9.8 mg/dL (ref 8.7–10.2)
GFR calc Af Amer: 144 mL/min/{1.73_m2} (ref >59)
GFR calc non Af Amer: 124 mL/min/{1.73_m2} (ref >59)
GLUCOSE: 89 mg/dL (ref 65–99)
Globulin, Total: 2.6 g/dL (ref 1.5–4.5)
Potassium: 5 mmol/L (ref 3.5–5.2)
Sodium: 141 mmol/L (ref 134–144)
Total Protein: 7.3 g/dL (ref 6.0–8.5)

## 2016-12-06 ENCOUNTER — Encounter: Payer: Self-pay | Admitting: Radiology

## 2016-12-18 ENCOUNTER — Other Ambulatory Visit: Payer: Self-pay

## 2016-12-18 ENCOUNTER — Ambulatory Visit: Payer: BLUE CROSS/BLUE SHIELD | Admitting: Emergency Medicine

## 2016-12-18 ENCOUNTER — Encounter: Payer: Self-pay | Admitting: Emergency Medicine

## 2016-12-18 VITALS — BP 98/68 | HR 91 | Temp 98.3°F | Resp 16 | Ht 68.25 in | Wt 169.2 lb

## 2016-12-18 DIAGNOSIS — R0602 Shortness of breath: Secondary | ICD-10-CM

## 2016-12-18 NOTE — Progress Notes (Signed)
Arthur Hood 26 y.o.   Chief Complaint  Patient presents with  . Follow-up    SOB per patient better, it has gone away    HISTORY OF PRESENT ILLNESS: This is a 26 y.o. male here for follow up of SOB 12/04/16 visit; feels 100% better. No new complaints. Pertinent labs & imaging results that were available during my care of the patient were reviewed by me and considered in my medical decision making (see chart for details).  HPI   Prior to Admission medications   Medication Sig Start Date End Date Taking? Authorizing Provider  oxyCODONE-acetaminophen (PERCOCET/ROXICET) 5-325 MG per tablet Take 1 tablet by mouth every 4 (four) hours as needed. Patient not taking: Reported on 12/04/2016 01/16/14   Garlon HatchetSanders, Lisa M, PA-C  sulfamethoxazole-trimethoprim (SEPTRA DS) 800-160 MG per tablet Take 1 tablet by mouth every 12 (twelve) hours. Patient not taking: Reported on 12/04/2016 01/16/14   Garlon HatchetSanders, Lisa M, PA-C    Allergies  Allergen Reactions  . Shellfish Allergy Other (See Comments)    Throat itchy    Patient Active Problem List   Diagnosis Date Noted  . Shortness of breath 12/04/2016  . Abnormal EKG 06/26/2011  . Dyspepsia 06/26/2011    No past medical history on file.  Past Surgical History:  Procedure Laterality Date  . adnoidectomy      Social History   Socioeconomic History  . Marital status: Single    Spouse name: Not on file  . Number of children: Not on file  . Years of education: Not on file  . Highest education level: Not on file  Social Needs  . Financial resource strain: Not on file  . Food insecurity - worry: Not on file  . Food insecurity - inability: Not on file  . Transportation needs - medical: Not on file  . Transportation needs - non-medical: Not on file  Occupational History  . Not on file  Tobacco Use  . Smoking status: Former Smoker    Types: Cigarettes    Last attempt to quit: 06/25/2010    Years since quitting: 6.4  . Smokeless tobacco:  Never Used  Substance and Sexual Activity  . Alcohol use: Yes  . Drug use: Yes  . Sexual activity: Not on file  Other Topics Concern  . Not on file  Social History Narrative  . Not on file    Family History  Problem Relation Age of Onset  . Mental illness Mother        anxiety disorder  . Diabetes Neg Hx   . Heart disease Neg Hx   . Cancer Neg Hx    Recent Results (from the past 2160 hour(s))  POCT CBC     Status: Abnormal   Collection Time: 12/04/16 12:06 PM  Result Value Ref Range   WBC 4.3 (A) 4.6 - 10.2 K/uL   Lymph, poc 1.4 0.6 - 3.4   POC LYMPH PERCENT 32.6 10 - 50 %L   MID (cbc) 0.4 0 - 0.9   POC MID % 9.8 0 - 12 %M   POC Granulocyte 2.5 2 - 6.9   Granulocyte percent 57.6 37 - 80 %G   RBC 4.58 (A) 4.69 - 6.13 M/uL   Hemoglobin 14.8 14.1 - 18.1 g/dL   HCT, POC 40.944.3 81.143.5 - 53.7 %   MCV 96.7 80 - 97 fL   MCH, POC 32.4 (A) 27 - 31.2 pg   MCHC 33.5 31.8 - 35.4 g/dL   RDW, POC 91.412.7 %  Platelet Count, POC 207 142 - 424 K/uL   MPV 7.0 0 - 99.8 fL  Comprehensive metabolic panel     Status: None   Collection Time: 12/04/16 12:06 PM  Result Value Ref Range   Glucose 89 65 - 99 mg/dL   BUN 9 6 - 20 mg/dL   Creatinine, Ser 1.61 0.76 - 1.27 mg/dL   GFR calc non Af Amer 124 >59 mL/min/1.73   GFR calc Af Amer 144 >59 mL/min/1.73   BUN/Creatinine Ratio 11 9 - 20   Sodium 141 134 - 144 mmol/L   Potassium 5.0 3.5 - 5.2 mmol/L   Chloride 103 96 - 106 mmol/L   CO2 26 20 - 29 mmol/L   Calcium 9.8 8.7 - 10.2 mg/dL   Total Protein 7.3 6.0 - 8.5 g/dL   Albumin 4.7 3.5 - 5.5 g/dL   Globulin, Total 2.6 1.5 - 4.5 g/dL   Albumin/Globulin Ratio 1.8 1.2 - 2.2   Bilirubin Total 0.5 0.0 - 1.2 mg/dL   Alkaline Phosphatase 58 39 - 117 IU/L   AST 14 0 - 40 IU/L   ALT 10 0 - 44 IU/L    Review of Systems  Constitutional: Negative.  Negative for chills and fever.  HENT: Negative.   Eyes: Negative.   Respiratory: Negative.  Negative for cough and shortness of breath.     Cardiovascular: Negative.  Negative for chest pain and palpitations.  Gastrointestinal: Negative.  Negative for abdominal pain, nausea and vomiting.  Genitourinary: Negative.   Skin: Negative for rash.  Neurological: Negative.   Endo/Heme/Allergies: Negative.   All other systems reviewed and are negative.   Vitals:   12/18/16 1351  BP: 98/68  Pulse: 91  Resp: 16  Temp: 98.3 F (36.8 C)  SpO2: 97%    Physical Exam  Constitutional: He is oriented to person, place, and time. He appears well-developed and well-nourished.  HENT:  Head: Normocephalic and atraumatic.  Eyes: EOM are normal. Pupils are equal, round, and reactive to light.  Neck: Normal range of motion. Neck supple.  Cardiovascular: Normal rate, regular rhythm and normal heart sounds.  Pulmonary/Chest: Effort normal and breath sounds normal.  Musculoskeletal: Normal range of motion.  Neurological: He is alert and oriented to person, place, and time.  Skin: Skin is warm and dry. Capillary refill takes less than 2 seconds. No rash noted.  Psychiatric: He has a normal mood and affect. His behavior is normal.  Vitals reviewed.    ASSESSMENT & PLAN: Arthur Hood was seen today for follow-up.  Diagnoses and all orders for this visit:  Shortness of breath Comments: resolved    Patient Instructions       IF you received an x-ray today, you will receive an invoice from [facility]. Please contact [facility] at [phone] with questions or concerns regarding your invoice.   IF you received labwork today, you will receive an invoice from [facility]. Please contact [facility] at [phone] with questions or concerns regarding your invoice.   Our billing staff will not be able to assist you with questions regarding bills from these companies.  You will be contacted with the lab results as soon as they are available. The fastest way to get your results is to activate your My Chart account.  Instructions are located on the last page of this paperwork. If you have not heard from [place] regarding the results in 2 weeks, please contact this office.     Health Maintenance, Male A healthy lifestyle and  preventive care is important for your health and wellness. Ask your health care provider about what schedule of regular examinations is right for you. What should I know about weight and diet? Eat a Healthy Diet  Eat plenty of vegetables, fruits, whole grains, low-fat dairy products, and lean protein.  Do not eat a lot of foods high in solid fats, added sugars, or salt.  Maintain a Healthy Weight Regular exercise can help you achieve or maintain a healthy weight. You should:  Do at least 150 minutes of exercise each week. The exercise should increase your heart rate and make you sweat (moderate-intensity exercise).  Do strength-training exercises at least twice a week.  Watch Your Levels of Cholesterol and Blood Lipids  Have your blood tested for lipids and cholesterol every 5 years starting at 25 years of age. If you are at high risk for heart disease, you should start having your blood tested when you are 25 years old. You may need to have your cholesterol levels checked more often if: ? Your lipid or cholesterol levels are high. ? You are older than 25 years of age. ? You are at high risk for heart disease.  What should I know about cancer screening? Many types of cancers can be detected early and may often be prevented. Lung Cancer  You should be screened every year for lung cancer if: ? You are a current smoker who has smoked for at least 30 years. ? You are a former smoker who has quit within the past 15 years.  Talk to your health care provider about your screening options, when you should start screening, and how often you should be screened.  Colorectal Cancer  Routine colorectal cancer screening usually begins at 25 years of age and should be repeated every 5-10  years until you are 25 years old. You may need to be screened more often if early forms of precancerous polyps or small growths are found. Your health care provider may recommend screening at an earlier age if you have risk factors for colon cancer.  Your health care provider may recommend using home test kits to check for hidden blood in the stool.  A small camera at the end of a tube can be used to examine your colon (sigmoidoscopy or colonoscopy). This checks for the earliest forms of colorectal cancer.  Prostate and Testicular Cancer  Depending on your age and overall health, your health care provider may do certain tests to screen for prostate and testicular cancer.  Talk to your health care provider about any symptoms or concerns you have about testicular or prostate cancer.  Skin Cancer  Check your skin from head to toe regularly.  Tell your health care provider about any new moles or changes in moles, especially if: ? There is a change in a mole's size, shape, or color. ? You have a mole that is larger than a pencil eraser.  Always use sunscreen. Apply sunscreen liberally and repeat throughout the day.  Protect yourself by wearing long sleeves, pants, a wide-brimmed hat, and sunglasses when outside.  What should I know about heart disease, diabetes, and high blood pressure?  If you are 59-25 years of age, have your blood pressure checked every 3-5 years. If you are 62 years of age or older, have your blood pressure checked every year. You should have your blood pressure measured twice-once when you are at a hospital or clinic, and once when you are not at a hospital or  clinic. Record the average of the two measurements. To check your blood pressure when you are not at a hospital or clinic, you can use: ? An automated blood pressure machine at a pharmacy. ? A home blood pressure monitor.  Talk to your health care provider about your target blood pressure.  If you are between  6545-25 years old, ask your health care provider if you should take aspirin to prevent heart disease.  Have regular diabetes screenings by checking your fasting blood sugar level. ? If you are at a normal weight and have a low risk for diabetes, have this test once every three years after the age of 25. ? If you are overweight and have a high risk for diabetes, consider being tested at a younger age or more often.  A one-time screening for abdominal aortic aneurysm (AAA) by ultrasound is recommended for men aged 65-75 years who are current or former smokers. What should I know about preventing infection? Hepatitis B If you have a higher risk for hepatitis B, you should be screened for this virus. Talk with your health care provider to find out if you are at risk for hepatitis B infection. Hepatitis C Blood testing is recommended for:  Everyone born from [birth year] through 1965.  Anyone with known risk factors for hepatitis C.  Sexually Transmitted Diseases (STDs)  You should be screened each year for STDs including gonorrhea and chlamydia if: ? You are sexually active and are younger than 25 years of age. ? You are older than 25 years of age and your health care provider tells you that you are at risk for this type of infection. ? Your sexual activity has changed since you were last screened and you are at an increased risk for chlamydia or gonorrhea. Ask your health care provider if you are at risk.  Talk with your health care provider about whether you are at high risk of being infected with HIV. Your health care provider may recommend a prescription medicine to help prevent HIV infection.  What else can I do?  Schedule regular health, dental, and eye exams.  Stay current with your vaccines (immunizations).  Do not use any tobacco products, such as cigarettes, chewing tobacco, and e-cigarettes. If you need help quitting, ask your health care provider.  Limit alcohol intake to no more than  2 drinks per day. One drink equals 12 ounces of beer, 5 ounces of wine, or 1 ounces of hard liquor.  Do not use street drugs.  Do not share needles.  Ask your health care provider for help if you need support or information about quitting drugs.  Tell your health care provider if you often feel depressed.  Tell your health care provider if you have ever been abused or do not feel safe at home. This information is not intended to replace advice given to you by your health care provider. Make sure you discuss any questions you have with your health care provider. Document Released: 06/22/2007 Document Revised: 08/23/2015 Document Reviewed: 09/27/2014 Elsevier Interactive Patient Education  2018 Elsevier Inc.      Edwina BarthMiguel Ellianna Ruest, MD [facility]

## 2016-12-18 NOTE — Patient Instructions (Addendum)
   IF you received an x-ray today, you will receive an invoice from [facility]. Please contact [facility] at [phone] with questions or concerns regarding your invoice.   IF you received labwork today, you will receive an invoice from [facility]. Please contact [facility] at [phone] with questions or concerns regarding your invoice.   Our billing staff will not be able to assist you with questions regarding bills from these companies.  You will be contacted with the lab results as soon as they are available. The fastest way to get your results is to activate your My Chart account. Instructions are located on the last page of this paperwork. If you have not heard from us regarding the results in 2 weeks, please contact this office.      Health Maintenance, Male A healthy lifestyle and preventive care is important for your health and wellness. Ask your health care provider about what schedule of regular examinations is right for you. What should I know about weight and diet? Eat a Healthy Diet  Eat plenty of vegetables, fruits, whole grains, low-fat dairy products, and lean protein.  Do not eat a lot of foods high in solid fats, added sugars, or salt.  Maintain a Healthy Weight Regular exercise can help you achieve or maintain a healthy weight. You should:  Do at least 150 minutes of exercise each week. The exercise should increase your heart rate and make you sweat (moderate-intensity exercise).  Do strength-training exercises at least twice a week.  Watch Your Levels of Cholesterol and Blood Lipids  Have your blood tested for lipids and cholesterol every 5 years starting at 25 years of age. If you are at high risk for heart disease, you should start having your blood tested when you are 25 years old. You may need to have your cholesterol levels checked more often if: ? Your lipid or cholesterol levels are high. ? You are older than 25 years of age. ? You  are at high risk for heart disease.  What should I know about cancer screening? Many types of cancers can be detected early and may often be prevented. Lung Cancer  You should be screened every year for lung cancer if: ? You are a current smoker who has smoked for at least 30 years. ? You are a former smoker who has quit within the past 15 years.  Talk to your health care provider about your screening options, when you should start screening, and how often you should be screened.  Colorectal Cancer  Routine colorectal cancer screening usually begins at 25 years of age and should be repeated every 5-10 years until you are 25 years old. You may need to be screened more often if early forms of precancerous polyps or small growths are found. Your health care provider may recommend screening at an earlier age if you have risk factors for colon cancer.  Your health care provider may recommend using home test kits to check for hidden blood in the stool.  A small camera at the end of a tube can be used to examine your colon (sigmoidoscopy or colonoscopy). This checks for the earliest forms of colorectal cancer.  Prostate and Testicular Cancer  Depending on your age and overall health, your health care provider may do certain tests to screen for prostate and testicular cancer.  Talk to your health care provider about any symptoms or concerns you have about testicular or prostate cancer.  Skin Cancer  Check your skin   from head to toe regularly.  Tell your health care provider about any new moles or changes in moles, especially if: ? There is a change in a mole's size, shape, or color. ? You have a mole that is larger than a pencil eraser.  Always use sunscreen. Apply sunscreen liberally and repeat throughout the day.  Protect yourself by wearing long sleeves, pants, a wide-brimmed hat, and sunglasses when outside.  What should I know about heart disease, diabetes, and high blood  pressure?  If you are 18-39 years of age, have your blood pressure checked every 3-5 years. If you are 40 years of age or older, have your blood pressure checked every year. You should have your blood pressure measured twice-once when you are at a hospital or clinic, and once when you are not at a hospital or clinic. Record the average of the two measurements. To check your blood pressure when you are not at a hospital or clinic, you can use: ? An automated blood pressure machine at a pharmacy. ? A home blood pressure monitor.  Talk to your health care provider about your target blood pressure.  If you are between 45-79 years old, ask your health care provider if you should take aspirin to prevent heart disease.  Have regular diabetes screenings by checking your fasting blood sugar level. ? If you are at a normal weight and have a low risk for diabetes, have this test once every three years after the age of 45. ? If you are overweight and have a high risk for diabetes, consider being tested at a younger age or more often.  A one-time screening for abdominal aortic aneurysm (AAA) by ultrasound is recommended for men aged 65-75 years who are current or former smokers. What should I know about preventing infection? Hepatitis B If you have a higher risk for hepatitis B, you should be screened for this virus. Talk with your health care provider to find out if you are at risk for hepatitis B infection. Hepatitis C Blood testing is recommended for:  Everyone born from 1945 through 1965.  Anyone with known risk factors for hepatitis C.  Sexually Transmitted Diseases (STDs)  You should be screened each year for STDs including gonorrhea and chlamydia if: ? You are sexually active and are younger than 24 years of age. ? You are older than 24 years of age and your health care provider tells you that you are at risk for this type of infection. ? Your sexual activity has changed since you were last  screened and you are at an increased risk for chlamydia or gonorrhea. Ask your health care provider if you are at risk.  Talk with your health care provider about whether you are at high risk of being infected with HIV. Your health care provider may recommend a prescription medicine to help prevent HIV infection.  What else can I do?  Schedule regular health, dental, and eye exams.  Stay current with your vaccines (immunizations).  Do not use any tobacco products, such as cigarettes, chewing tobacco, and e-cigarettes. If you need help quitting, ask your health care provider.  Limit alcohol intake to no more than 2 drinks per day. One drink equals 12 ounces of beer, 5 ounces of wine, or 1 ounces of hard liquor.  Do not use street drugs.  Do not share needles.  Ask your health care provider for help if you need support or information about quitting drugs.  Tell your health care   provider if you often feel depressed.  Tell your health care provider if you have ever been abused or do not feel safe at home. This information is not intended to replace advice given to you by your health care provider. Make sure you discuss any questions you have with your health care provider. Document Released: 06/22/2007 Document Revised: 08/23/2015 Document Reviewed: 09/27/2014 Elsevier Interactive Patient Education  2018 [employer]vier Inc.  

## 2019-09-29 IMAGING — DX DG CHEST 2V
2 series · 2 of 2 positions shown · non-contrast
Comparison: None.

CLINICAL DATA: Short of breath

EXAM:
CHEST  2 VIEW

[chest pa]
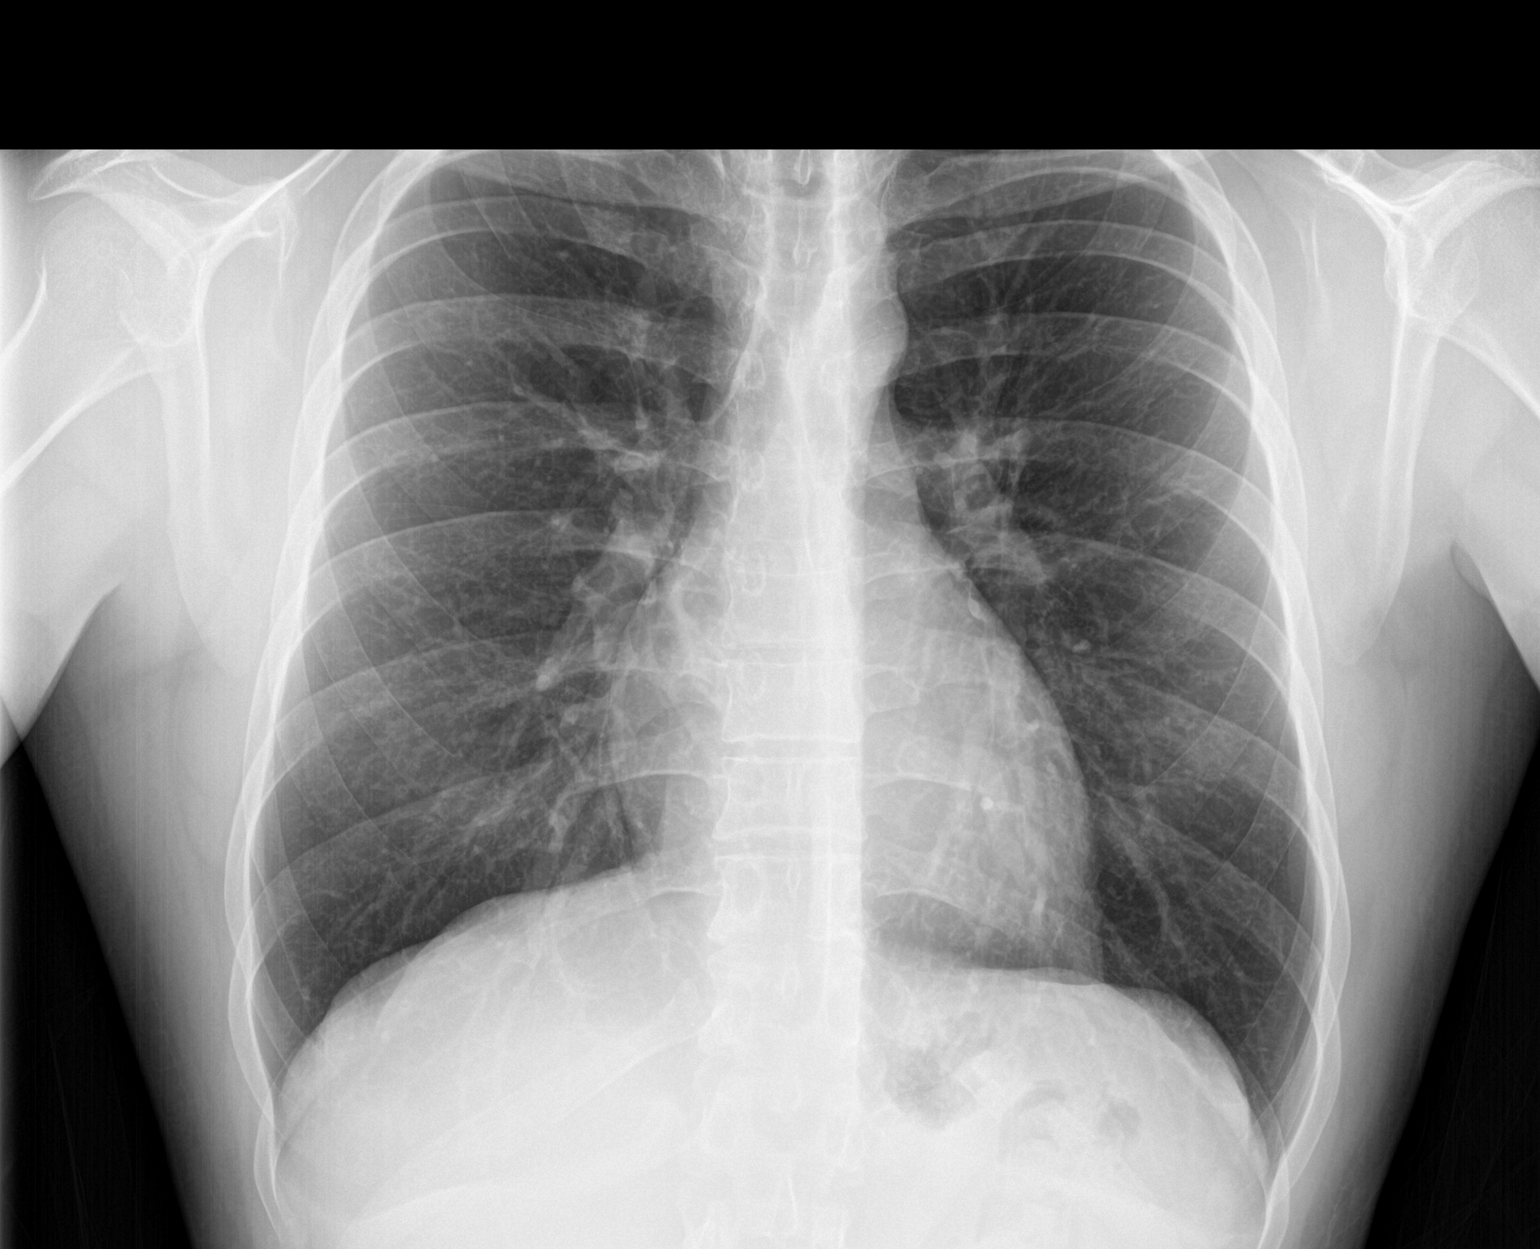

[chest lat]
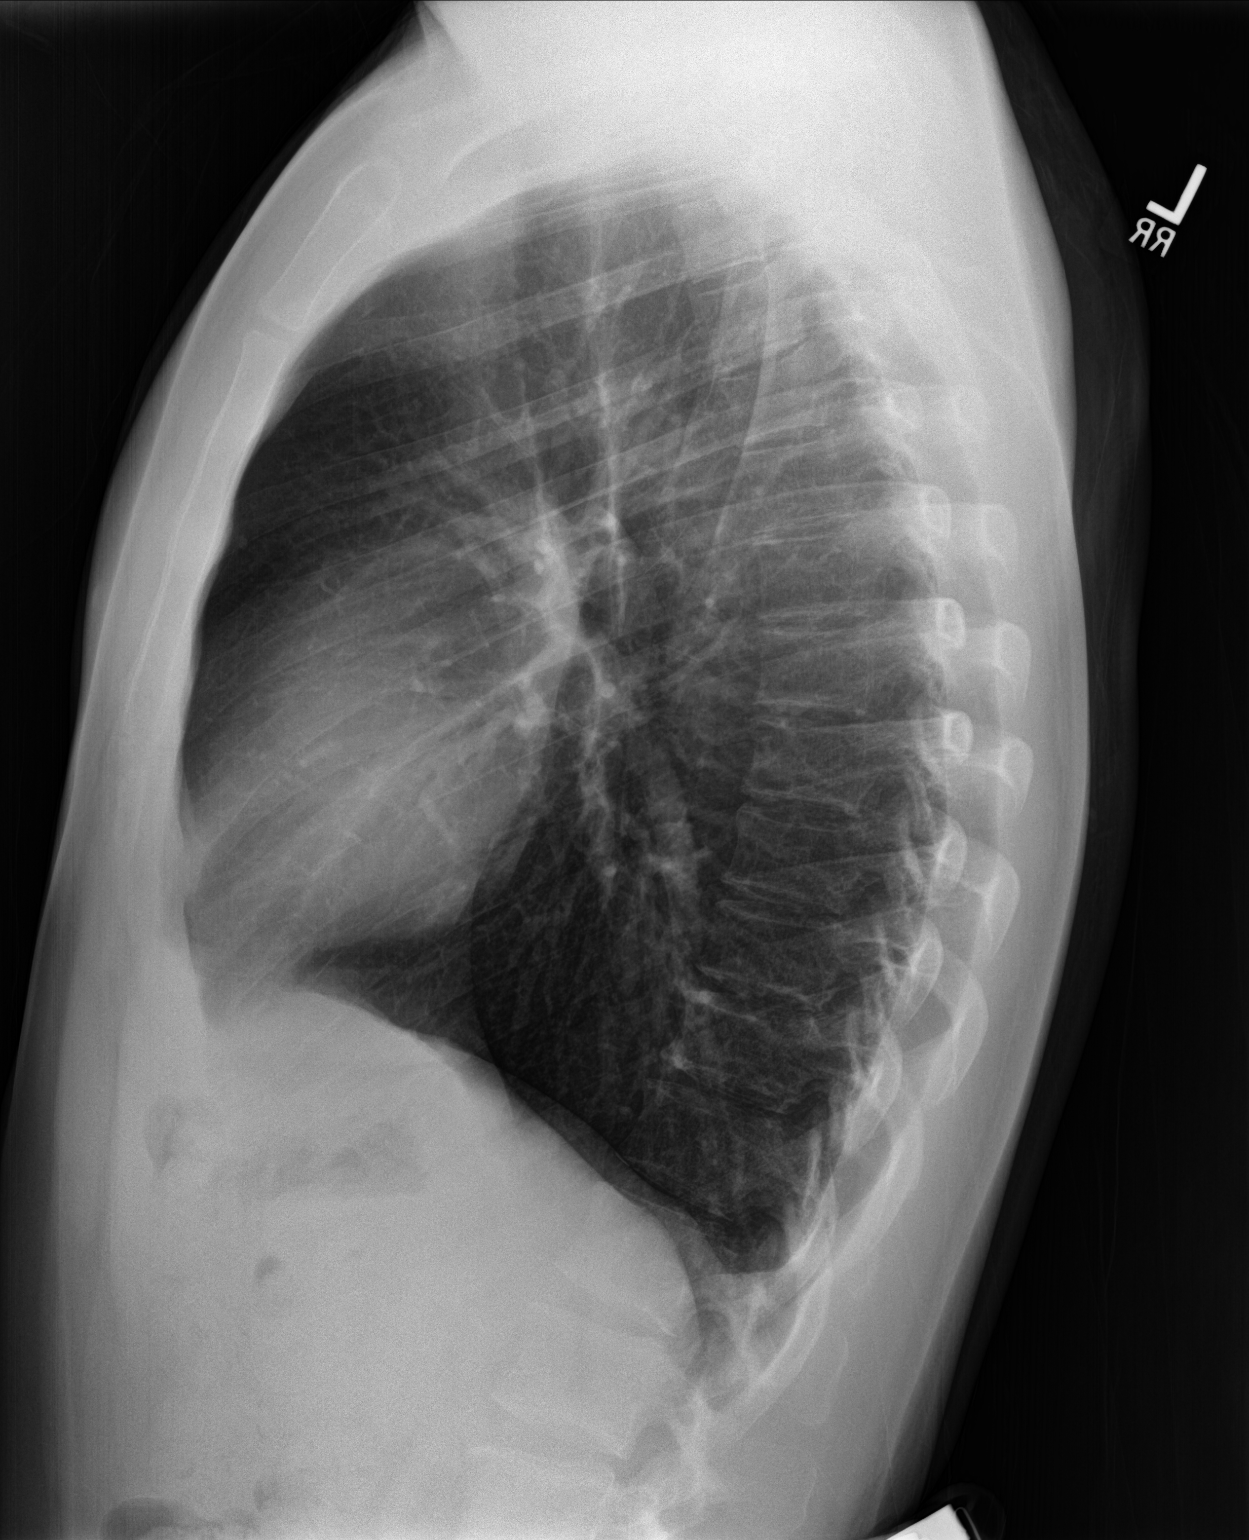

[2 of 2 positions shown; findings below may reference images not displayed]

FINDINGS: Normal heart size. Lungs hyperaerated clear. No pneumothorax. No
pleural effusion.
IMPRESSION: Hyperaerated and clear lungs.
